# Patient Record
Sex: Female | Born: 1960 | Race: Black or African American | Hispanic: No | Marital: Single | Smoking: Never smoker
Health system: Southern US, Community
[De-identification: ages and names within clinical notes are randomized; demographics above are authoritative.]

## PROBLEM LIST (undated history)

## (undated) DIAGNOSIS — M542 Cervicalgia: Secondary | ICD-10-CM

## (undated) DIAGNOSIS — M545 Low back pain, unspecified: Secondary | ICD-10-CM

## (undated) DIAGNOSIS — I10 Essential (primary) hypertension: Secondary | ICD-10-CM

## (undated) DIAGNOSIS — E785 Hyperlipidemia, unspecified: Secondary | ICD-10-CM

## (undated) HISTORY — DX: Essential (primary) hypertension: I10

## (undated) HISTORY — PX: WISDOM TOOTH EXTRACTION: SHX21

## (undated) HISTORY — DX: Low back pain: M54.5

## (undated) HISTORY — PX: MOUTH SURGERY: SHX715

## (undated) HISTORY — DX: Low back pain, unspecified: M54.50

## (undated) HISTORY — DX: Hyperlipidemia, unspecified: E78.5

## (undated) HISTORY — PX: DILATION AND CURETTAGE OF UTERUS: SHX78

## (undated) HISTORY — PX: COLONOSCOPY: SHX174

## (undated) HISTORY — DX: Cervicalgia: M54.2

---

## 1999-03-17 ENCOUNTER — Other Ambulatory Visit: Admission: RE | Admit: 1999-03-17 | Discharge: 1999-03-17 | Payer: Self-pay | Admitting: Obstetrics and Gynecology

## 2000-08-03 ENCOUNTER — Other Ambulatory Visit: Admission: RE | Admit: 2000-08-03 | Discharge: 2000-08-03 | Payer: Self-pay | Admitting: Obstetrics and Gynecology

## 2000-09-28 ENCOUNTER — Ambulatory Visit (HOSPITAL_COMMUNITY): Admission: RE | Admit: 2000-09-28 | Discharge: 2000-09-28 | Payer: Self-pay | Admitting: Obstetrics and Gynecology

## 2000-09-28 ENCOUNTER — Encounter: Payer: Self-pay | Admitting: Obstetrics and Gynecology

## 2003-05-14 ENCOUNTER — Other Ambulatory Visit: Admission: RE | Admit: 2003-05-14 | Discharge: 2003-05-14 | Payer: Self-pay | Admitting: Obstetrics and Gynecology

## 2003-05-22 ENCOUNTER — Ambulatory Visit (HOSPITAL_COMMUNITY): Admission: RE | Admit: 2003-05-22 | Discharge: 2003-05-22 | Payer: Self-pay | Admitting: Obstetrics and Gynecology

## 2004-06-17 ENCOUNTER — Other Ambulatory Visit: Admission: RE | Admit: 2004-06-17 | Discharge: 2004-06-17 | Payer: Self-pay | Admitting: Obstetrics and Gynecology

## 2004-07-08 ENCOUNTER — Ambulatory Visit (HOSPITAL_COMMUNITY): Admission: RE | Admit: 2004-07-08 | Discharge: 2004-07-08 | Payer: Self-pay | Admitting: Obstetrics and Gynecology

## 2005-08-19 ENCOUNTER — Ambulatory Visit (HOSPITAL_COMMUNITY): Admission: RE | Admit: 2005-08-19 | Discharge: 2005-08-19 | Payer: Self-pay | Admitting: Obstetrics and Gynecology

## 2005-08-24 ENCOUNTER — Other Ambulatory Visit: Admission: RE | Admit: 2005-08-24 | Discharge: 2005-08-24 | Payer: Self-pay | Admitting: Obstetrics and Gynecology

## 2006-08-31 ENCOUNTER — Ambulatory Visit (HOSPITAL_COMMUNITY): Admission: RE | Admit: 2006-08-31 | Discharge: 2006-08-31 | Payer: Self-pay | Admitting: Obstetrics and Gynecology

## 2007-09-06 ENCOUNTER — Ambulatory Visit (HOSPITAL_COMMUNITY): Admission: RE | Admit: 2007-09-06 | Discharge: 2007-09-06 | Payer: Self-pay | Admitting: Obstetrics and Gynecology

## 2008-01-15 ENCOUNTER — Encounter: Admission: RE | Admit: 2008-01-15 | Discharge: 2008-01-15 | Payer: Self-pay | Admitting: Family Medicine

## 2008-10-09 ENCOUNTER — Ambulatory Visit (HOSPITAL_COMMUNITY): Admission: RE | Admit: 2008-10-09 | Discharge: 2008-10-09 | Payer: Self-pay | Admitting: Obstetrics and Gynecology

## 2009-10-20 ENCOUNTER — Ambulatory Visit (HOSPITAL_COMMUNITY): Admission: RE | Admit: 2009-10-20 | Discharge: 2009-10-20 | Payer: Self-pay | Admitting: Obstetrics and Gynecology

## 2011-03-15 ENCOUNTER — Other Ambulatory Visit (HOSPITAL_COMMUNITY): Payer: Self-pay | Admitting: Obstetrics and Gynecology

## 2011-03-15 DIAGNOSIS — Z1231 Encounter for screening mammogram for malignant neoplasm of breast: Secondary | ICD-10-CM

## 2011-03-23 ENCOUNTER — Ambulatory Visit (HOSPITAL_COMMUNITY)
Admission: RE | Admit: 2011-03-23 | Discharge: 2011-03-23 | Disposition: A | Discharge status: Home / Self Care | Payer: BC Managed Care – PPO | Source: Ambulatory Visit | Attending: Obstetrics and Gynecology | Admitting: Obstetrics and Gynecology

## 2011-03-23 DIAGNOSIS — Z1231 Encounter for screening mammogram for malignant neoplasm of breast: Secondary | ICD-10-CM | POA: Insufficient documentation

## 2012-03-28 ENCOUNTER — Encounter: Payer: Self-pay | Admitting: Obstetrics and Gynecology

## 2012-03-28 ENCOUNTER — Other Ambulatory Visit: Payer: Self-pay | Admitting: Obstetrics and Gynecology

## 2012-03-28 ENCOUNTER — Ambulatory Visit (INDEPENDENT_AMBULATORY_CARE_PROVIDER_SITE_OTHER): Payer: BC Managed Care – PPO | Admitting: Obstetrics and Gynecology

## 2012-03-28 VITALS — BP 130/72 | Ht 63.5 in | Wt 160.0 lb

## 2012-03-28 DIAGNOSIS — Z01419 Encounter for gynecological examination (general) (routine) without abnormal findings: Secondary | ICD-10-CM

## 2012-03-28 DIAGNOSIS — Z1231 Encounter for screening mammogram for malignant neoplasm of breast: Secondary | ICD-10-CM

## 2012-03-28 DIAGNOSIS — Z124 Encounter for screening for malignant neoplasm of cervix: Secondary | ICD-10-CM

## 2012-03-28 NOTE — Progress Notes (Signed)
Subjective:    Cheyenne Armstrong is a 51 y.o. female G1P0 who presents for annual exam. The patient complains of trouble sleeping. She is not sexually active.  The following portions of the patient's history were reviewed and updated as appropriate: allergies, current medications, past family history, past medical history, past social history, past surgical history and problem list.  Review of Systems Pertinent items are noted in HPI. Gastrointestinal:No change in bowel habits, no abdominal pain, no rectal bleeding Genitourinary:negative for dysuria, frequency, hematuria, nocturia and urinary incontinence    Objective:     BP 130/72  Ht 5' 3.5" (1.613 m)  Wt 160 lb (72.576 kg)  BMI 27.90 kg/m2  Weight:  Wt Readings from Last 1 Encounters:  03/28/12 160 lb (72.576 kg)     BMI: Body mass index is 27.90 kg/(m^2). General Appearance: Alert, appropriate appearance for age. No acute distress HEENT: Grossly normal Neck / Thyroid: Supple, no masses, nodes or enlargement Lungs: clear to auscultation bilaterally Back: No CVA tenderness Breast Exam: No masses or nodes.No dimpling, nipple retraction or discharge. Cardiovascular: Regular rate and rhythm. S1, S2, no murmur Gastrointestinal: Soft, non-tender, no masses or organomegaly  ++++++++++++++++++++++++++++++++++++++++++++++++++++++++  Pelvic Exam: External genitalia: normal general appearance Vaginal: normal without tenderness, induration or masses and atrophic mucosa Cervix: normal appearance Adnexa: normal bimanual exam Uterus: normal size shape and consistency Rectovaginal: normal rectal, no masses  ++++++++++++++++++++++++++++++++++++++++++++++++++++++++  Lymphatic Exam: Non-palpable nodes in neck, clavicular, axillary, or inguinal regions  Psychiatric: Alert and oriented, appropriate affect.@OBJECTIVEEN @      Assessment:    Normal gyn exam Menopause   Insomnia  Overweight or obese: Yes  Pelvic relaxation:  No  Menopausal symptoms: Yes. Severe: Yes.   Plan:    Mammogram. Pap smear.   Follow-up:  for annual exam.  Strategies for improving sleeve were reviewed.  The updated Pap smear screening guidelines were discussed with the patient. The patient requested that I obtain a Pap smear: Yes.  Kegel exercises discussed: Yes.  Proper diet and regular exercise were reviewed.  Annual mammograms recommended starting at age 75. Proper breast care was discussed.  Screening colonoscopy is recommended beginning at age 43.  Regular health maintenance was reviewed.  Sleep hygiene was discussed.  Adequate calcium and vitamin D intake was emphasized.  Leonard Schwartz M.D.   Regular Periods: no Mammogram: yes  Monthly Breast Ex.: no Exercise: yes  Tetanus < 10 years: yes Seatbelts: yes  NI. Bladder Functn.: yes Abuse at home: no  Daily BM's: yes Stressful Work: no  Healthy Diet: yes Sigmoid-Colonoscopy: n/a  Calcium: no Medical problems this year: pt c/o lack of sleep   LAST PAP:03/26/2009  Contraception: Condoms  Mammogram:  03/2011  PCP: Dr. Sherlyn Lick  PMH: None  FMH: None  Last Bone Scan: N/A

## 2012-03-29 LAB — PAP IG W/ RFLX HPV ASCU

## 2012-04-05 ENCOUNTER — Ambulatory Visit (HOSPITAL_COMMUNITY)
Admission: RE | Admit: 2012-04-05 | Discharge: 2012-04-05 | Disposition: A | Discharge status: Home / Self Care | Payer: BC Managed Care – PPO | Source: Ambulatory Visit | Attending: Obstetrics and Gynecology | Admitting: Obstetrics and Gynecology

## 2012-04-05 DIAGNOSIS — Z1231 Encounter for screening mammogram for malignant neoplasm of breast: Secondary | ICD-10-CM | POA: Insufficient documentation

## 2012-04-07 ENCOUNTER — Encounter: Payer: Self-pay | Admitting: Internal Medicine

## 2012-04-27 ENCOUNTER — Ambulatory Visit (AMBULATORY_SURGERY_CENTER): Payer: BC Managed Care – PPO

## 2012-04-27 ENCOUNTER — Encounter: Payer: Self-pay | Admitting: Internal Medicine

## 2012-04-27 VITALS — Ht 64.0 in | Wt 159.9 lb

## 2012-04-27 DIAGNOSIS — Z1211 Encounter for screening for malignant neoplasm of colon: Secondary | ICD-10-CM

## 2012-04-27 MED ORDER — NA SULFATE-K SULFATE-MG SULF 17.5-3.13-1.6 GM/177ML PO SOLN: 1.0000 | Freq: Once | ORAL | Status: DC

## 2012-05-26 ENCOUNTER — Encounter: Payer: Self-pay | Admitting: Internal Medicine

## 2012-05-26 ENCOUNTER — Ambulatory Visit (AMBULATORY_SURGERY_CENTER): Payer: BC Managed Care – PPO | Admitting: Internal Medicine

## 2012-05-26 VITALS — BP 134/84 | HR 65 | Temp 96.4°F | Resp 18 | Ht 64.0 in | Wt 159.0 lb

## 2012-05-26 DIAGNOSIS — K5730 Diverticulosis of large intestine without perforation or abscess without bleeding: Secondary | ICD-10-CM

## 2012-05-26 DIAGNOSIS — Z1211 Encounter for screening for malignant neoplasm of colon: Secondary | ICD-10-CM

## 2012-05-26 MED ORDER — SODIUM CHLORIDE 0.9 % IV SOLN: 500.0000 mL | INTRAVENOUS | Status: DC

## 2012-05-26 NOTE — Progress Notes (Signed)
Patient did not experience any of the following events: a burn prior to discharge; a fall within the facility; wrong site/side/patient/procedure/implant event; or a hospital transfer or hospital admission upon discharge from the facility. (G8907) Patient did not have preoperative order for IV antibiotic SSI prophylaxis. (G8918)  

## 2012-05-26 NOTE — Op Note (Signed)
[facility] [address] [place], [address]   COLONOSCOPY PROCEDURE REPORT  PATIENT: Cheyenne Armstrong, Cheyenne Armstrong  MR#: [id number] BIRTHDATE: June 03, 1961 , 51  yrs. old GENDER: Female ENDOSCOPIST: Iva Boop, MD, Clementeen Graham REFERRED BY:   Janine Limbo, M.D. PROCEDURE DATE:  05/26/2012 PROCEDURE:   Colonoscopy, diagnostic ASA CLASS:   Class II INDICATIONS:average risk screening. MEDICATIONS: propofol (Diprivan) 200mg  IV, MAC sedation, administered by CRNA, and These medications were titrated to patient response per physician's verbal order  DESCRIPTION OF PROCEDURE:   After the risks benefits and alternatives of the procedure were thoroughly explained, informed consent was obtained.  A digital rectal exam revealed no abnormalities of the rectum.   The LB [id number]  endoscope was introduced through the anus and advanced to the cecum, which was identified by both the appendix and ileocecal valve. No adverse events experienced.   The quality of the prep was Suprep excellent The instrument was then slowly withdrawn as the colon was fully examined.      COLON FINDINGS: Moderate diverticulosis was noted The finding was in the left colon.   The colon mucosa was otherwise normal. Retroflexed views revealed no abnormalities. The time to cecum=1 minutes 53 seconds.  Withdrawal time=7 minutes 05 seconds.  The scope was withdrawn and the procedure completed. COMPLICATIONS: There were no complications.  ENDOSCOPIC IMPRESSION: 1.   Moderate diverticulosis was noted in the left colon 2.   The colon mucosa was otherwise normal - excellent prep  RECOMMENDATIONS: Repeat Colonscopy in 10 years.   eSigned:  Iva Boop, MD, [facility] 05/26/2012 11:11 AM   cc: The Patient, Kirkland Hun, MD and Milus Height, [place]

## 2012-05-26 NOTE — Patient Instructions (Addendum)
There was diverticulosis in your colon (pouches and thickened muscles) but no polyps or cancer. This is good news!  Next routine colonoscopy in 10 years.   Thank you for choosing me and [facility].  Iva Boop, MD, FACG   YOU HAD AN ENDOSCOPIC PROCEDURE TODAY AT THE  ENDOSCOPY CENTER: Refer to the procedure report that was given to you for any specific questions about what was found during the examination.  If the procedure report does not answer your questions, please call your gastroenterologist to clarify.  If you requested that your care partner not be given the details of your procedure findings, then the procedure report has been included in a sealed envelope for you to review at your convenience later.  YOU SHOULD EXPECT: Some feelings of bloating in the abdomen. Passage of more gas than usual.  Walking can help get rid of the air that was put into your GI tract during the procedure and reduce the bloating. If you had a lower endoscopy (such as a colonoscopy or flexible sigmoidoscopy) you may notice spotting of blood in your stool or on the toilet paper. If you underwent a bowel prep for your procedure, then you may not have a normal bowel movement for a few days.  DIET: Your first meal following the procedure should be a light meal and then it is ok to progress to your normal diet.  A half-sandwich or bowl of soup is an example of a good first meal.  Heavy or fried foods are harder to digest and may make you feel nauseous or bloated.  Likewise meals heavy in dairy and vegetables can cause extra gas to form and this can also increase the bloating.  Drink plenty of fluids but you should avoid alcoholic beverages for 24 hours.  ACTIVITY: Your care partner should take you home directly after the procedure.  You should plan to take it easy, moving slowly for the rest of the day.  You can resume normal activity the day after the procedure however you should NOT DRIVE or  use heavy machinery for 24 hours (because of the sedation medicines used during the test).    SYMPTOMS TO REPORT IMMEDIATELY: A gastroenterologist can be reached at any hour.  During normal business hours, 8:30 AM to 5:00 PM Monday through Friday, call [phone].  After hours and on weekends, please call the GI answering service at [phone] who will take a message and have the physician on call contact you.   Following lower endoscopy (colonoscopy or flexible sigmoidoscopy):  Excessive amounts of blood in the stool  Significant tenderness or worsening of abdominal pains  Swelling of the abdomen that is new, acute  Fever of 100F or higher  Following upper endoscopy (EGD)  Vomiting of blood or coffee ground material  New chest pain or pain under the shoulder blades  Painful or persistently difficult swallowing  New shortness of breath  Fever of 100F or higher  Black, tarry-looking stools  FOLLOW UP: If any biopsies were taken you will be contacted by phone or by letter within the next 1-3 weeks.  Call your gastroenterologist if you have not heard about the biopsies in 3 weeks.  Our staff will call the home number listed on your records the next business day following your procedure to check on you and address any questions or concerns that you may have at that time regarding the information given to you following your procedure. This is a courtesy call  and so if there is no answer at the home number and we have not heard from you through the emergency physician on call, we will assume that you have returned to your regular daily activities without incident.  SIGNATURES/CONFIDENTIALITY: You and/or your care partner have signed paperwork which will be entered into your electronic medical record.  These signatures attest to the fact that that the information above on your After Visit Summary has been reviewed and is understood.  Full responsibility of the confidentiality of this  discharge information lies with you and/or your care-partner. YOU HAD AN ENDOSCOPIC PROCEDURE TODAY AT THE [facility]: Refer to the procedure report that was given to you for any specific questions about what was found during the examination.  If the procedure report does not answer your questions, please call your gastroenterologist to clarify.  If you requested that your care partner not be given the details of your procedure findings, then the procedure report has been included in a sealed envelope for you to review at your convenience later.  YOU SHOULD EXPECT: Some feelings of bloating in the abdomen. Passage of more gas than usual.  Walking can help get rid of the air that was put into your GI tract during the procedure and reduce the bloating. If you had a lower endoscopy (such as a colonoscopy or flexible sigmoidoscopy) you may notice spotting of blood in your stool or on the toilet paper. If you underwent a bowel prep for your procedure, then you may not have a normal bowel movement for a few days.  DIET: Your first meal following the procedure should be a light meal and then it is ok to progress to your normal diet.  A half-sandwich or bowl of soup is an example of a good first meal.  Heavy or fried foods are harder to digest and may make you feel nauseous or bloated.  Likewise meals heavy in dairy and vegetables can cause extra gas to form and this can also increase the bloating.  Drink plenty of fluids but you should avoid alcoholic beverages for 24 hours.  ACTIVITY: Your care partner should take you home directly after the procedure.  You should plan to take it easy, moving slowly for the rest of the day.  You can resume normal activity the day after the procedure however you should NOT DRIVE or use heavy machinery for 24 hours (because of the sedation medicines used during the test).    SYMPTOMS TO REPORT IMMEDIATELY: A gastroenterologist can be reached at any hour.  During  normal business hours, 8:30 AM to 5:00 PM Monday through Friday, call [phone].  After hours and on weekends, please call the GI answering service at [phone] who will take a message and have the physician on call contact you.   Following lower endoscopy (colonoscopy or flexible sigmoidoscopy):  Excessive amounts of blood in the stool  Significant tenderness or worsening of abdominal pains  Swelling of the abdomen that is new, acute  Fever of 100F or higher  Following upper endoscopy (EGD)  Vomiting of blood or coffee ground material  New chest pain or pain under the shoulder blades  Painful or persistently difficult swallowing  New shortness of breath  Fever of 100F or higher  Black, tarry-looking stools  FOLLOW UP: If any biopsies were taken you will be contacted by phone or by letter within the next 1-3 weeks.  Call your gastroenterologist if you have not heard about the biopsies  in 3 weeks.  Our staff will call the home number listed on your records the next business day following your procedure to check on you and address any questions or concerns that you may have at that time regarding the information given to you following your procedure. This is a courtesy call and so if there is no answer at the home number and we have not heard from you through the emergency physician on call, we will assume that you have returned to your regular daily activities without incident.  SIGNATURES/CONFIDENTIALITY: You and/or your care partner have signed paperwork which will be entered into your electronic medical record.  These signatures attest to the fact that that the information above on your After Visit Summary has been reviewed and is understood.  Full responsibility of the confidentiality of this discharge information lies with you and/or your care-partner.    Diverticulosis and high fiber information given.  Repeat colonoscopy in 10 years 2023

## 2012-05-26 NOTE — Progress Notes (Signed)
No egg or soy allergy per pt. ewm 

## 2012-05-29 ENCOUNTER — Telehealth: Payer: Self-pay | Admitting: *Deleted

## 2012-05-29 NOTE — Telephone Encounter (Signed)
  Follow up Call-  Call back number 05/26/2012  Post procedure Call Back phone  # [phone]  Permission to leave phone message Yes     No answer,left message!

## 2013-06-11 ENCOUNTER — Other Ambulatory Visit: Payer: Self-pay | Admitting: Obstetrics and Gynecology

## 2013-06-11 DIAGNOSIS — Z1231 Encounter for screening mammogram for malignant neoplasm of breast: Secondary | ICD-10-CM

## 2013-06-15 ENCOUNTER — Ambulatory Visit (HOSPITAL_COMMUNITY)
Admission: RE | Admit: 2013-06-15 | Discharge: 2013-06-15 | Disposition: A | Discharge status: Home / Self Care | Payer: BC Managed Care – PPO | Source: Ambulatory Visit | Attending: Obstetrics and Gynecology | Admitting: Obstetrics and Gynecology

## 2013-06-15 DIAGNOSIS — Z1231 Encounter for screening mammogram for malignant neoplasm of breast: Secondary | ICD-10-CM | POA: Insufficient documentation

## 2014-04-15 ENCOUNTER — Encounter: Payer: Self-pay | Admitting: Internal Medicine

## 2014-07-04 ENCOUNTER — Other Ambulatory Visit (HOSPITAL_COMMUNITY): Payer: Self-pay | Admitting: Obstetrics and Gynecology

## 2014-07-04 DIAGNOSIS — Z1231 Encounter for screening mammogram for malignant neoplasm of breast: Secondary | ICD-10-CM

## 2014-07-10 ENCOUNTER — Ambulatory Visit (HOSPITAL_COMMUNITY)
Admission: RE | Admit: 2014-07-10 | Discharge: 2014-07-10 | Disposition: A | Discharge status: Home / Self Care | Payer: BC Managed Care – PPO | Source: Ambulatory Visit | Attending: Obstetrics and Gynecology | Admitting: Obstetrics and Gynecology

## 2014-07-10 DIAGNOSIS — Z1231 Encounter for screening mammogram for malignant neoplasm of breast: Secondary | ICD-10-CM | POA: Diagnosis not present

## 2015-09-10 ENCOUNTER — Other Ambulatory Visit: Payer: Self-pay

## 2015-09-10 DIAGNOSIS — Z1231 Encounter for screening mammogram for malignant neoplasm of breast: Secondary | ICD-10-CM

## 2015-09-29 ENCOUNTER — Ambulatory Visit: Payer: BC Managed Care – PPO

## 2015-10-23 ENCOUNTER — Ambulatory Visit: Payer: BC Managed Care – PPO

## 2015-11-21 ENCOUNTER — Ambulatory Visit
Admission: RE | Admit: 2015-11-21 | Discharge: 2015-11-21 | Disposition: A | Discharge status: Home / Self Care | Payer: BC Managed Care – PPO | Source: Ambulatory Visit

## 2015-11-21 DIAGNOSIS — Z1231 Encounter for screening mammogram for malignant neoplasm of breast: Secondary | ICD-10-CM

## 2016-11-24 ENCOUNTER — Other Ambulatory Visit: Payer: Self-pay | Admitting: Obstetrics and Gynecology

## 2016-11-24 DIAGNOSIS — Z1231 Encounter for screening mammogram for malignant neoplasm of breast: Secondary | ICD-10-CM

## 2016-12-27 ENCOUNTER — Ambulatory Visit: Payer: BC Managed Care – PPO

## 2016-12-27 ENCOUNTER — Ambulatory Visit
Admission: RE | Admit: 2016-12-27 | Discharge: 2016-12-27 | Disposition: A | Discharge status: Home / Self Care | Payer: BC Managed Care – PPO | Source: Ambulatory Visit | Attending: Obstetrics and Gynecology | Admitting: Obstetrics and Gynecology

## 2016-12-27 DIAGNOSIS — Z1231 Encounter for screening mammogram for malignant neoplasm of breast: Secondary | ICD-10-CM

## 2018-01-25 ENCOUNTER — Other Ambulatory Visit: Payer: Self-pay | Admitting: Obstetrics and Gynecology

## 2018-01-25 DIAGNOSIS — Z1231 Encounter for screening mammogram for malignant neoplasm of breast: Secondary | ICD-10-CM

## 2018-02-20 ENCOUNTER — Ambulatory Visit
Admission: RE | Admit: 2018-02-20 | Discharge: 2018-02-20 | Disposition: A | Discharge status: Home / Self Care | Payer: BC Managed Care – PPO | Source: Ambulatory Visit | Attending: Obstetrics and Gynecology | Admitting: Obstetrics and Gynecology

## 2018-02-20 DIAGNOSIS — Z1231 Encounter for screening mammogram for malignant neoplasm of breast: Secondary | ICD-10-CM

## 2018-11-24 ENCOUNTER — Other Ambulatory Visit: Payer: Self-pay | Admitting: Physician Assistant

## 2018-11-24 DIAGNOSIS — Z13820 Encounter for screening for osteoporosis: Secondary | ICD-10-CM

## 2018-11-24 DIAGNOSIS — Z1231 Encounter for screening mammogram for malignant neoplasm of breast: Secondary | ICD-10-CM

## 2018-12-04 ENCOUNTER — Other Ambulatory Visit: Payer: Self-pay | Admitting: *Deleted

## 2018-12-04 DIAGNOSIS — Z20822 Contact with and (suspected) exposure to covid-19: Secondary | ICD-10-CM

## 2018-12-10 LAB — NOVEL CORONAVIRUS, NAA: SARS-CoV-2, NAA: NOT DETECTED

## 2019-02-23 ENCOUNTER — Other Ambulatory Visit: Payer: Self-pay

## 2019-02-23 ENCOUNTER — Ambulatory Visit
Admission: RE | Admit: 2019-02-23 | Discharge: 2019-02-23 | Disposition: A | Discharge status: Home / Self Care | Payer: BC Managed Care – PPO | Source: Ambulatory Visit | Attending: Physician Assistant | Admitting: Physician Assistant

## 2019-02-23 DIAGNOSIS — Z13820 Encounter for screening for osteoporosis: Secondary | ICD-10-CM

## 2019-02-23 DIAGNOSIS — Z1231 Encounter for screening mammogram for malignant neoplasm of breast: Secondary | ICD-10-CM

## 2019-02-26 ENCOUNTER — Other Ambulatory Visit: Payer: Self-pay | Admitting: Physician Assistant

## 2019-02-26 DIAGNOSIS — R928 Other abnormal and inconclusive findings on diagnostic imaging of breast: Secondary | ICD-10-CM

## 2019-02-27 ENCOUNTER — Other Ambulatory Visit: Payer: Self-pay

## 2019-02-27 ENCOUNTER — Ambulatory Visit
Admission: RE | Admit: 2019-02-27 | Discharge: 2019-02-27 | Disposition: A | Discharge status: Home / Self Care | Payer: BC Managed Care – PPO | Source: Ambulatory Visit | Attending: Physician Assistant | Admitting: Physician Assistant

## 2019-02-27 ENCOUNTER — Other Ambulatory Visit: Payer: BC Managed Care – PPO

## 2019-02-27 ENCOUNTER — Ambulatory Visit: Admission: RE | Admit: 2019-02-27 | Payer: BC Managed Care – PPO | Source: Ambulatory Visit

## 2019-02-27 DIAGNOSIS — R928 Other abnormal and inconclusive findings on diagnostic imaging of breast: Secondary | ICD-10-CM

## 2019-03-02 ENCOUNTER — Other Ambulatory Visit: Payer: BC Managed Care – PPO

## 2020-04-04 ENCOUNTER — Other Ambulatory Visit: Payer: Self-pay | Admitting: Physician Assistant

## 2020-04-04 DIAGNOSIS — Z1231 Encounter for screening mammogram for malignant neoplasm of breast: Secondary | ICD-10-CM

## 2020-06-23 DIAGNOSIS — Z20822 Contact with and (suspected) exposure to covid-19: Secondary | ICD-10-CM | POA: Diagnosis not present

## 2020-07-21 DIAGNOSIS — E7800 Pure hypercholesterolemia, unspecified: Secondary | ICD-10-CM | POA: Diagnosis not present

## 2020-07-21 DIAGNOSIS — Z0000 Encounter for general adult medical examination without abnormal findings: Secondary | ICD-10-CM | POA: Diagnosis not present

## 2020-07-21 DIAGNOSIS — I10 Essential (primary) hypertension: Secondary | ICD-10-CM | POA: Diagnosis not present

## 2020-08-19 DIAGNOSIS — M9901 Segmental and somatic dysfunction of cervical region: Secondary | ICD-10-CM | POA: Diagnosis not present

## 2020-08-19 DIAGNOSIS — M9902 Segmental and somatic dysfunction of thoracic region: Secondary | ICD-10-CM | POA: Diagnosis not present

## 2020-08-19 DIAGNOSIS — M531 Cervicobrachial syndrome: Secondary | ICD-10-CM | POA: Diagnosis not present

## 2020-08-19 DIAGNOSIS — M5032 Other cervical disc degeneration, mid-cervical region, unspecified level: Secondary | ICD-10-CM | POA: Diagnosis not present

## 2020-08-20 DIAGNOSIS — Z124 Encounter for screening for malignant neoplasm of cervix: Secondary | ICD-10-CM | POA: Diagnosis not present

## 2020-08-20 DIAGNOSIS — Z01419 Encounter for gynecological examination (general) (routine) without abnormal findings: Secondary | ICD-10-CM | POA: Diagnosis not present

## 2020-08-25 DIAGNOSIS — H40013 Open angle with borderline findings, low risk, bilateral: Secondary | ICD-10-CM | POA: Diagnosis not present

## 2020-08-27 DIAGNOSIS — M9902 Segmental and somatic dysfunction of thoracic region: Secondary | ICD-10-CM | POA: Diagnosis not present

## 2020-08-27 DIAGNOSIS — M9901 Segmental and somatic dysfunction of cervical region: Secondary | ICD-10-CM | POA: Diagnosis not present

## 2020-08-27 DIAGNOSIS — M531 Cervicobrachial syndrome: Secondary | ICD-10-CM | POA: Diagnosis not present

## 2020-08-27 DIAGNOSIS — M5032 Other cervical disc degeneration, mid-cervical region, unspecified level: Secondary | ICD-10-CM | POA: Diagnosis not present

## 2020-09-08 DIAGNOSIS — M9901 Segmental and somatic dysfunction of cervical region: Secondary | ICD-10-CM | POA: Diagnosis not present

## 2020-09-08 DIAGNOSIS — M5032 Other cervical disc degeneration, mid-cervical region, unspecified level: Secondary | ICD-10-CM | POA: Diagnosis not present

## 2020-09-08 DIAGNOSIS — M531 Cervicobrachial syndrome: Secondary | ICD-10-CM | POA: Diagnosis not present

## 2020-09-08 DIAGNOSIS — M9902 Segmental and somatic dysfunction of thoracic region: Secondary | ICD-10-CM | POA: Diagnosis not present

## 2020-09-22 DIAGNOSIS — M5032 Other cervical disc degeneration, mid-cervical region, unspecified level: Secondary | ICD-10-CM | POA: Diagnosis not present

## 2020-09-22 DIAGNOSIS — M531 Cervicobrachial syndrome: Secondary | ICD-10-CM | POA: Diagnosis not present

## 2020-09-22 DIAGNOSIS — M9901 Segmental and somatic dysfunction of cervical region: Secondary | ICD-10-CM | POA: Diagnosis not present

## 2020-09-22 DIAGNOSIS — M9902 Segmental and somatic dysfunction of thoracic region: Secondary | ICD-10-CM | POA: Diagnosis not present

## 2020-09-25 ENCOUNTER — Ambulatory Visit: Payer: BC Managed Care – PPO

## 2020-09-25 ENCOUNTER — Ambulatory Visit: Payer: Self-pay | Attending: Internal Medicine

## 2020-09-25 DIAGNOSIS — Z23 Encounter for immunization: Secondary | ICD-10-CM

## 2020-09-25 NOTE — Progress Notes (Signed)
   Covid-19 Vaccination Clinic  Name:  Cheyenne Armstrong    MRN: [id number] DOB: 12-Dec-1960  09/25/2020  [name] was observed post Covid-19 immunization for 15 minutes without incident. She was provided with Vaccine Information Sheet and instruction to access the V-Safe system.   [name] was instructed to call 911 with any severe reactions post vaccine: Marland Kitchen Difficulty breathing  . Swelling of face and throat  . A fast heartbeat  . A bad rash all over body  . Dizziness and weakness   Immunizations Administered    Name Date Dose VIS Date Route   PFIZER Comrnaty(Gray TOP) Covid-19 Vaccine 09/25/2020 12:33 PM 0.3 mL 05/22/2020 Intramuscular   Manufacturer: [employer], [employer]   Lot: [id number]   NDC: [phone]

## 2020-09-29 ENCOUNTER — Other Ambulatory Visit (HOSPITAL_BASED_OUTPATIENT_CLINIC_OR_DEPARTMENT_OTHER): Payer: Self-pay

## 2020-09-29 MED ORDER — PFIZER-BIONT COVID-19 VAC-TRIS 30 MCG/0.3ML IM SUSP
INTRAMUSCULAR | 0 refills | Status: DC
  Filled 2020-09-29: qty 0.3, 1d supply, fill #0

## 2020-10-10 DIAGNOSIS — H40013 Open angle with borderline findings, low risk, bilateral: Secondary | ICD-10-CM | POA: Diagnosis not present

## 2020-10-15 DIAGNOSIS — M9902 Segmental and somatic dysfunction of thoracic region: Secondary | ICD-10-CM | POA: Diagnosis not present

## 2020-10-15 DIAGNOSIS — M531 Cervicobrachial syndrome: Secondary | ICD-10-CM | POA: Diagnosis not present

## 2020-10-15 DIAGNOSIS — M5032 Other cervical disc degeneration, mid-cervical region, unspecified level: Secondary | ICD-10-CM | POA: Diagnosis not present

## 2020-10-15 DIAGNOSIS — M9901 Segmental and somatic dysfunction of cervical region: Secondary | ICD-10-CM | POA: Diagnosis not present

## 2020-10-27 ENCOUNTER — Ambulatory Visit: Payer: Self-pay | Attending: Critical Care Medicine

## 2020-10-27 DIAGNOSIS — Z20822 Contact with and (suspected) exposure to covid-19: Secondary | ICD-10-CM | POA: Insufficient documentation

## 2020-10-28 LAB — SARS-COV-2, NAA 2 DAY TAT

## 2020-10-28 LAB — NOVEL CORONAVIRUS, NAA: SARS-CoV-2, NAA: NOT DETECTED

## 2020-12-11 DIAGNOSIS — J309 Allergic rhinitis, unspecified: Secondary | ICD-10-CM | POA: Diagnosis not present

## 2020-12-18 DIAGNOSIS — R0981 Nasal congestion: Secondary | ICD-10-CM | POA: Diagnosis not present

## 2020-12-18 DIAGNOSIS — J302 Other seasonal allergic rhinitis: Secondary | ICD-10-CM | POA: Diagnosis not present

## 2021-01-29 DIAGNOSIS — H401231 Low-tension glaucoma, bilateral, mild stage: Secondary | ICD-10-CM | POA: Diagnosis not present

## 2021-03-13 ENCOUNTER — Other Ambulatory Visit (HOSPITAL_BASED_OUTPATIENT_CLINIC_OR_DEPARTMENT_OTHER): Payer: Self-pay

## 2021-03-13 ENCOUNTER — Ambulatory Visit: Payer: Self-pay | Attending: Internal Medicine

## 2021-03-13 DIAGNOSIS — Z23 Encounter for immunization: Secondary | ICD-10-CM

## 2021-03-13 MED ORDER — INFLUENZA VAC SPLIT QUAD 0.5 ML IM SUSY
PREFILLED_SYRINGE | INTRAMUSCULAR | 0 refills | Status: DC
  Filled 2021-03-13: qty 0.5, 1d supply, fill #0

## 2021-03-13 NOTE — Progress Notes (Signed)
   Covid-19 Vaccination Clinic  Name:  Jennfier Abdulla    MRN: [id number] DOB: 08-10-1960  03/13/2021  Ms. Broski was observed post Covid-19 immunization for 15 minutes without incident. She was provided with Vaccine Information Sheet and instruction to access the V-Safe system.   Ms. Drumwright was instructed to call 911 with any severe reactions post vaccine: Difficulty breathing  Swelling of face and throat  A fast heartbeat  A bad rash all over body  Dizziness and weakness

## 2021-03-24 ENCOUNTER — Other Ambulatory Visit (HOSPITAL_BASED_OUTPATIENT_CLINIC_OR_DEPARTMENT_OTHER): Payer: Self-pay

## 2021-03-24 MED ORDER — COVID-19MRNA BIVAL VACC PFIZER 30 MCG/0.3ML IM SUSP
INTRAMUSCULAR | 0 refills | Status: DC
  Filled 2021-03-24: qty 0.3, 1d supply, fill #0

## 2021-03-30 ENCOUNTER — Ambulatory Visit: Payer: Self-pay

## 2021-04-29 DIAGNOSIS — M5032 Other cervical disc degeneration, mid-cervical region, unspecified level: Secondary | ICD-10-CM | POA: Diagnosis not present

## 2021-04-29 DIAGNOSIS — M9901 Segmental and somatic dysfunction of cervical region: Secondary | ICD-10-CM | POA: Diagnosis not present

## 2021-04-29 DIAGNOSIS — M531 Cervicobrachial syndrome: Secondary | ICD-10-CM | POA: Diagnosis not present

## 2021-04-29 DIAGNOSIS — M9902 Segmental and somatic dysfunction of thoracic region: Secondary | ICD-10-CM | POA: Diagnosis not present

## 2021-05-12 DIAGNOSIS — M5032 Other cervical disc degeneration, mid-cervical region, unspecified level: Secondary | ICD-10-CM | POA: Diagnosis not present

## 2021-05-12 DIAGNOSIS — M9901 Segmental and somatic dysfunction of cervical region: Secondary | ICD-10-CM | POA: Diagnosis not present

## 2021-05-12 DIAGNOSIS — M531 Cervicobrachial syndrome: Secondary | ICD-10-CM | POA: Diagnosis not present

## 2021-05-12 DIAGNOSIS — M9902 Segmental and somatic dysfunction of thoracic region: Secondary | ICD-10-CM | POA: Diagnosis not present

## 2021-07-16 DIAGNOSIS — R52 Pain, unspecified: Secondary | ICD-10-CM | POA: Diagnosis not present

## 2021-07-16 DIAGNOSIS — J029 Acute pharyngitis, unspecified: Secondary | ICD-10-CM | POA: Diagnosis not present

## 2021-07-16 DIAGNOSIS — I10 Essential (primary) hypertension: Secondary | ICD-10-CM | POA: Diagnosis not present

## 2021-07-16 DIAGNOSIS — Z03818 Encounter for observation for suspected exposure to other biological agents ruled out: Secondary | ICD-10-CM | POA: Diagnosis not present

## 2021-07-16 DIAGNOSIS — R059 Cough, unspecified: Secondary | ICD-10-CM | POA: Diagnosis not present

## 2021-08-20 DIAGNOSIS — E7800 Pure hypercholesterolemia, unspecified: Secondary | ICD-10-CM | POA: Diagnosis not present

## 2021-08-20 DIAGNOSIS — J302 Other seasonal allergic rhinitis: Secondary | ICD-10-CM | POA: Diagnosis not present

## 2021-08-20 DIAGNOSIS — Z0000 Encounter for general adult medical examination without abnormal findings: Secondary | ICD-10-CM | POA: Diagnosis not present

## 2021-08-20 DIAGNOSIS — I10 Essential (primary) hypertension: Secondary | ICD-10-CM | POA: Diagnosis not present

## 2021-09-21 ENCOUNTER — Other Ambulatory Visit: Payer: Self-pay | Admitting: Physician Assistant

## 2021-09-21 DIAGNOSIS — Z1231 Encounter for screening mammogram for malignant neoplasm of breast: Secondary | ICD-10-CM

## 2021-09-22 ENCOUNTER — Ambulatory Visit
Admission: RE | Admit: 2021-09-22 | Discharge: 2021-09-22 | Disposition: A | Discharge status: Home / Self Care | Payer: BC Managed Care – PPO | Source: Ambulatory Visit | Attending: Physician Assistant | Admitting: Physician Assistant

## 2021-09-22 DIAGNOSIS — Z1231 Encounter for screening mammogram for malignant neoplasm of breast: Secondary | ICD-10-CM | POA: Diagnosis not present

## 2021-11-26 DIAGNOSIS — H401231 Low-tension glaucoma, bilateral, mild stage: Secondary | ICD-10-CM | POA: Diagnosis not present

## 2022-05-25 IMAGING — MG MM DIGITAL SCREENING BILAT W/ TOMO AND CAD
8 series · 8 of 24 positions shown · non-contrast
Comparison: Previous exam(s).

CLINICAL DATA: Screening.

EXAM:
DIGITAL SCREENING BILATERAL MAMMOGRAM WITH TOMOSYNTHESIS AND CAD
TECHNIQUE: Bilateral screening digital craniocaudal and mediolateral oblique
mammograms were obtained. Bilateral screening digital breast
tomosynthesis was performed. The images were evaluated with
computer-aided detection.

[L CC synth-2D]
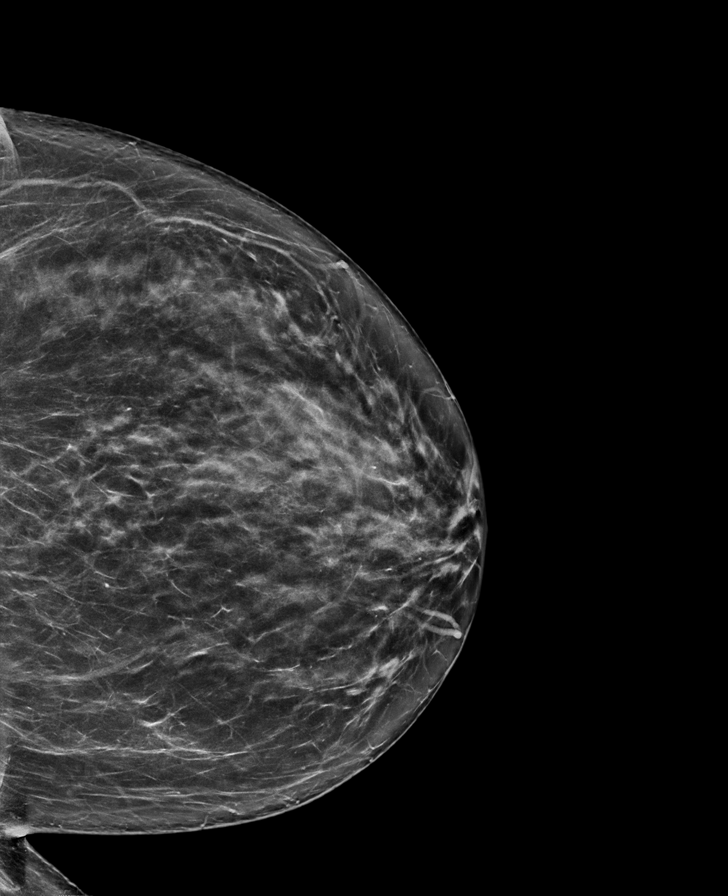

[R CC synth-2D]
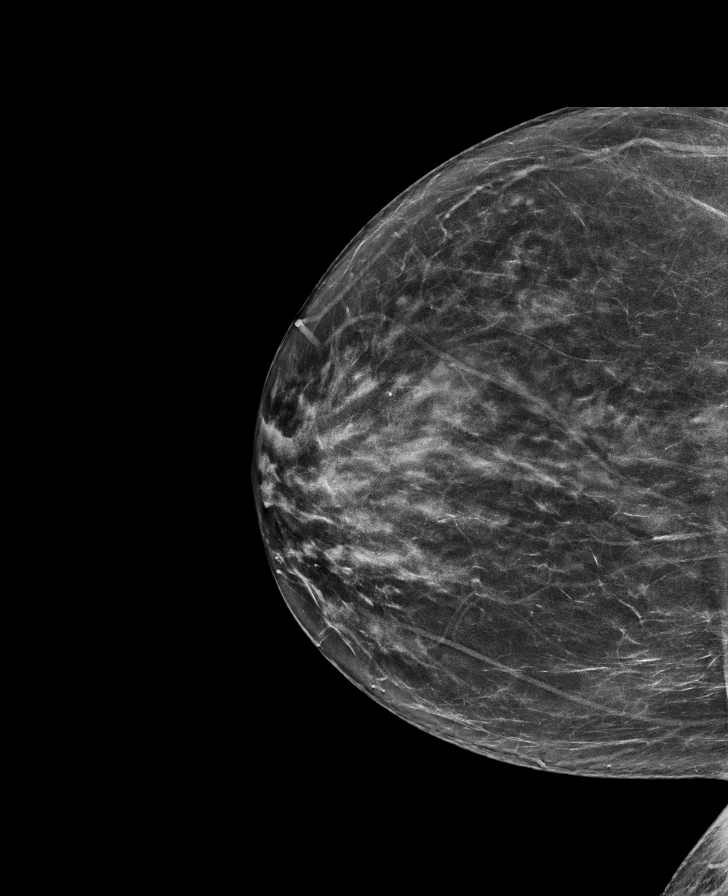

[R MLO synth-2D]
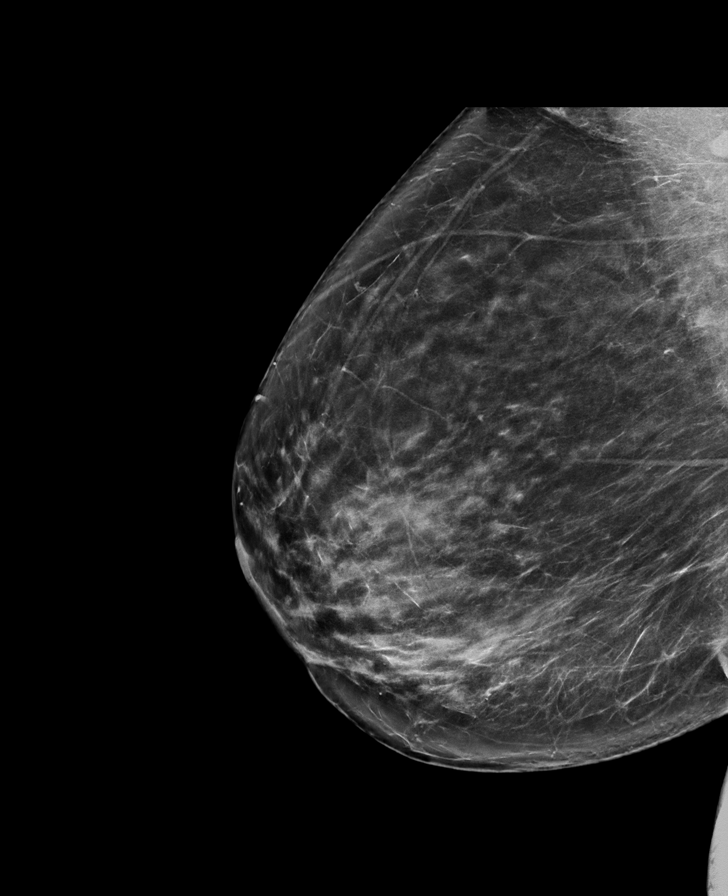

[L MLO synth-2D]
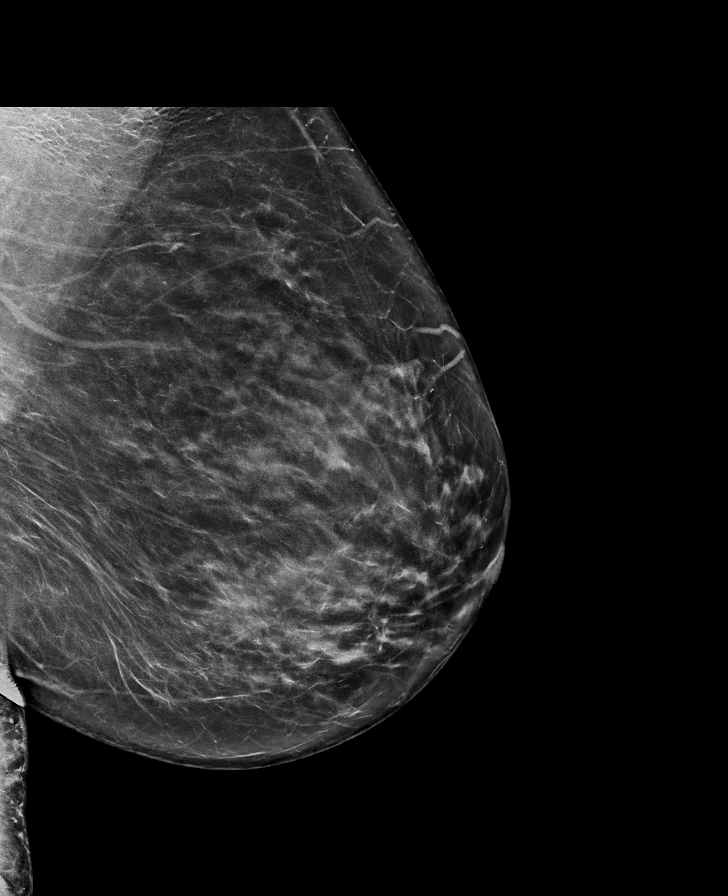

[R MLO tomo · tomo slice 42/83.0]
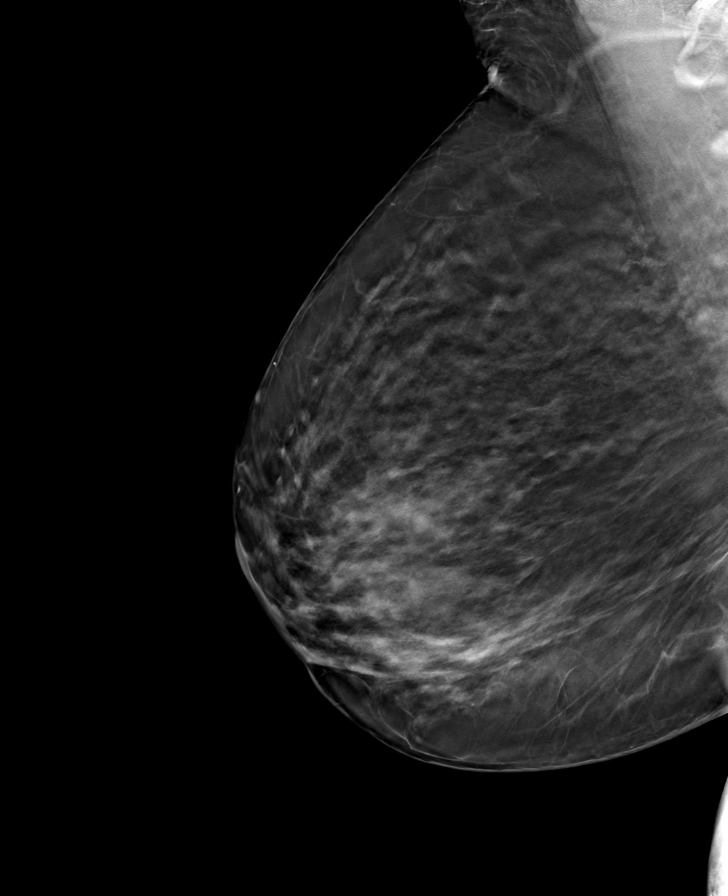

[L MLO tomo · tomo slice 44/87.0]
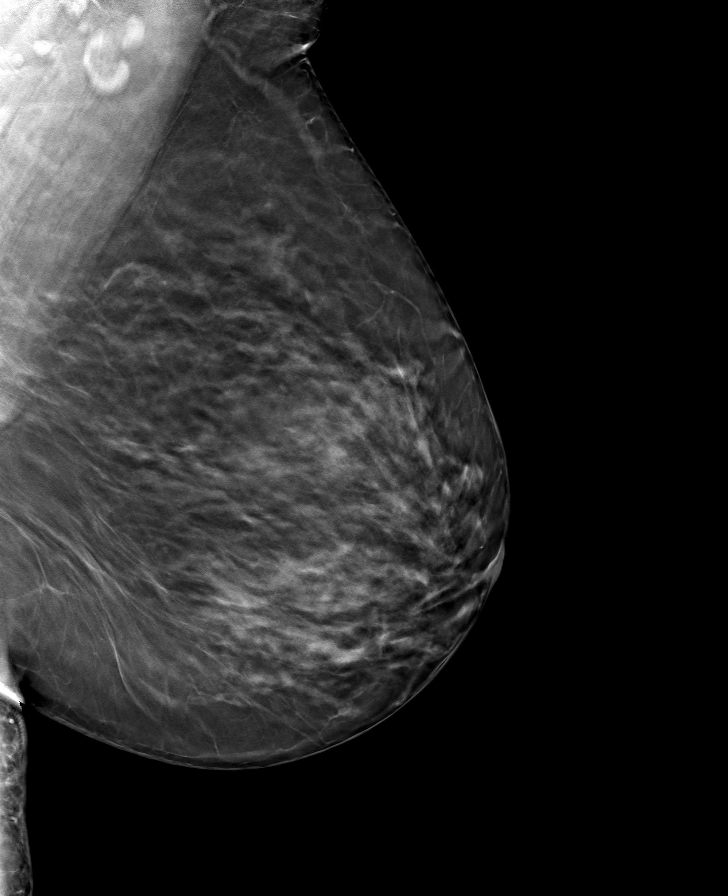

[L CC tomo · tomo slice 39/77.0]
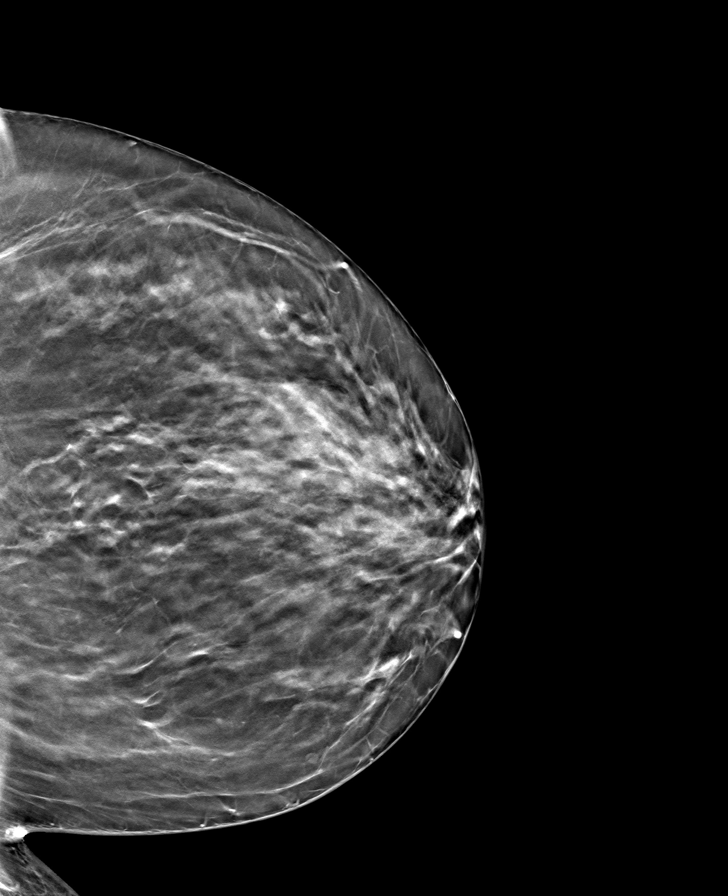

[R CC tomo · tomo slice 39/76.0]
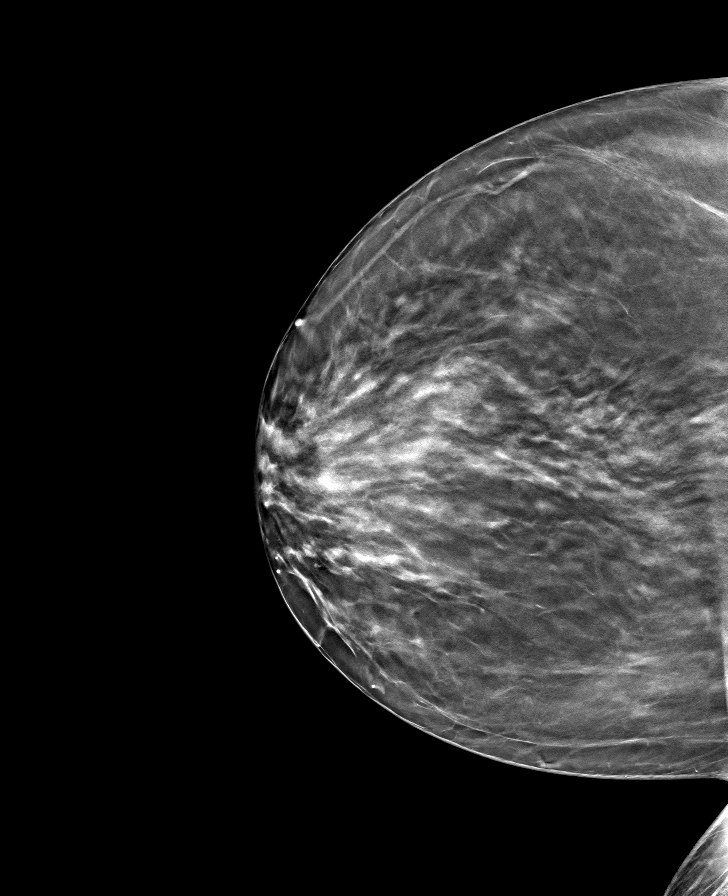

[8 of 24 positions shown; findings below may reference images not displayed]

ACR Breast Density Category c: The breast tissue is heterogeneously
dense, which may obscure small masses.
FINDINGS: There are no findings suspicious for malignancy.
IMPRESSION: No mammographic evidence of malignancy. A result letter of this
screening mammogram will be mailed directly to the patient.

RECOMMENDATION:
Screening mammogram in one year. (Code:Q3-W-BC3)

BI-RADS CATEGORY  1: Negative.

## 2022-06-09 ENCOUNTER — Encounter: Payer: Self-pay | Admitting: Internal Medicine

## 2022-08-31 ENCOUNTER — Other Ambulatory Visit: Payer: Self-pay | Admitting: Physician Assistant

## 2022-08-31 DIAGNOSIS — Z1231 Encounter for screening mammogram for malignant neoplasm of breast: Secondary | ICD-10-CM

## 2022-09-01 DIAGNOSIS — E7800 Pure hypercholesterolemia, unspecified: Secondary | ICD-10-CM | POA: Diagnosis not present

## 2022-09-01 DIAGNOSIS — J302 Other seasonal allergic rhinitis: Secondary | ICD-10-CM | POA: Diagnosis not present

## 2022-09-01 DIAGNOSIS — Z0000 Encounter for general adult medical examination without abnormal findings: Secondary | ICD-10-CM | POA: Diagnosis not present

## 2022-09-01 DIAGNOSIS — I10 Essential (primary) hypertension: Secondary | ICD-10-CM | POA: Diagnosis not present

## 2022-09-06 DIAGNOSIS — M9903 Segmental and somatic dysfunction of lumbar region: Secondary | ICD-10-CM | POA: Diagnosis not present

## 2022-09-06 DIAGNOSIS — M9902 Segmental and somatic dysfunction of thoracic region: Secondary | ICD-10-CM | POA: Diagnosis not present

## 2022-09-06 DIAGNOSIS — M9901 Segmental and somatic dysfunction of cervical region: Secondary | ICD-10-CM | POA: Diagnosis not present

## 2022-09-06 DIAGNOSIS — M5386 Other specified dorsopathies, lumbar region: Secondary | ICD-10-CM | POA: Diagnosis not present

## 2022-09-13 DIAGNOSIS — M9901 Segmental and somatic dysfunction of cervical region: Secondary | ICD-10-CM | POA: Diagnosis not present

## 2022-09-13 DIAGNOSIS — M9902 Segmental and somatic dysfunction of thoracic region: Secondary | ICD-10-CM | POA: Diagnosis not present

## 2022-09-13 DIAGNOSIS — M9903 Segmental and somatic dysfunction of lumbar region: Secondary | ICD-10-CM | POA: Diagnosis not present

## 2022-09-13 DIAGNOSIS — M5386 Other specified dorsopathies, lumbar region: Secondary | ICD-10-CM | POA: Diagnosis not present

## 2022-09-20 DIAGNOSIS — M5386 Other specified dorsopathies, lumbar region: Secondary | ICD-10-CM | POA: Diagnosis not present

## 2022-09-20 DIAGNOSIS — M9901 Segmental and somatic dysfunction of cervical region: Secondary | ICD-10-CM | POA: Diagnosis not present

## 2022-09-20 DIAGNOSIS — M9903 Segmental and somatic dysfunction of lumbar region: Secondary | ICD-10-CM | POA: Diagnosis not present

## 2022-09-20 DIAGNOSIS — M9902 Segmental and somatic dysfunction of thoracic region: Secondary | ICD-10-CM | POA: Diagnosis not present

## 2022-09-29 DIAGNOSIS — M9901 Segmental and somatic dysfunction of cervical region: Secondary | ICD-10-CM | POA: Diagnosis not present

## 2022-09-29 DIAGNOSIS — M9903 Segmental and somatic dysfunction of lumbar region: Secondary | ICD-10-CM | POA: Diagnosis not present

## 2022-09-29 DIAGNOSIS — M9902 Segmental and somatic dysfunction of thoracic region: Secondary | ICD-10-CM | POA: Diagnosis not present

## 2022-09-29 DIAGNOSIS — M5386 Other specified dorsopathies, lumbar region: Secondary | ICD-10-CM | POA: Diagnosis not present

## 2022-10-06 ENCOUNTER — Ambulatory Visit (AMBULATORY_SURGERY_CENTER): Payer: BC Managed Care – PPO | Admitting: *Deleted

## 2022-10-06 ENCOUNTER — Encounter: Payer: Self-pay | Admitting: Internal Medicine

## 2022-10-06 VITALS — Ht 63.5 in | Wt 160.0 lb

## 2022-10-06 DIAGNOSIS — Z1211 Encounter for screening for malignant neoplasm of colon: Secondary | ICD-10-CM

## 2022-10-06 MED ORDER — NA SULFATE-K SULFATE-MG SULF 17.5-3.13-1.6 GM/177ML PO SOLN: 1.0000 | Freq: Once | ORAL | 0 refills | Status: AC

## 2022-10-06 NOTE — Progress Notes (Addendum)
Pt's name and DOB verified at the beginning of the pre-visit.  Pt denies any difficulty with ambulating.,sitting laying down or turning side to side No egg or soy allergy known to patient  No issues known to pt with past sedation with any surgeries or procedures Patient denies ever being intubated Pt has no issues moving head neck or swallowing No FH of Malignant Hyperthermia Pt is not on diet pills Pt is not on home 02  Pt is not on blood thinners  Pt denies issues with constipation  Pt is not on dialysis Pt denies any upcoming cardiac testing Pt encouraged to use to use Singlecare or Goodrx to reduce cost  Patient's chart reviewed by Cathlyn Parsons CNRA prior to pre-visit and patient appropriate for the [facility].  Pre-visit completed and red dot placed by patient's name on their procedure day (on provider's schedule).  . Visit by phone Pt states weight is 160 lb Instructed pt why it is important to and  to call if they have any changes in health or new medications. Directed them to the # given and on instructions.   Pt states they will.  Instructions reviewed with pt and pt states understanding. Instructed to review again prior to procedure. Pt states they will.  Instructions sent by mail with coupon and by my chart

## 2022-10-13 ENCOUNTER — Ambulatory Visit
Admission: RE | Admit: 2022-10-13 | Discharge: 2022-10-13 | Disposition: A | Discharge status: Home / Self Care | Payer: BC Managed Care – PPO | Source: Ambulatory Visit | Attending: Physician Assistant | Admitting: Physician Assistant

## 2022-10-13 DIAGNOSIS — Z1231 Encounter for screening mammogram for malignant neoplasm of breast: Secondary | ICD-10-CM

## 2022-10-29 ENCOUNTER — Encounter: Payer: Self-pay | Admitting: Internal Medicine

## 2022-10-29 ENCOUNTER — Ambulatory Visit (AMBULATORY_SURGERY_CENTER): Payer: BC Managed Care – PPO | Admitting: Internal Medicine

## 2022-10-29 VITALS — BP 142/68 | HR 49 | Temp 97.5°F | Resp 16 | Ht 63.0 in | Wt 160.0 lb

## 2022-10-29 DIAGNOSIS — Z1211 Encounter for screening for malignant neoplasm of colon: Secondary | ICD-10-CM

## 2022-10-29 MED ORDER — SODIUM CHLORIDE 0.9 % IV SOLN: 500.0000 mL | Freq: Once | INTRAVENOUS | Status: DC

## 2022-10-29 NOTE — Progress Notes (Signed)
Pt's states no medical or surgical changes since previsit or office visit. 

## 2022-10-29 NOTE — Progress Notes (Signed)
Gastroenterology History and Physical   Primary Care Physician:  Milus Height, PA   Reason for Procedure:   CRCA screening  Plan:    colonoscopy     HPI: Cheyenne Armstrong is a 62 y.o. female for repeat screening colonoscopy, last exam negative I n2013   Past Medical History:  Diagnosis Date   Hyperlipidemia    Hypertension    Low back pain    chronic   Neck pain on right side     Past Surgical History:  Procedure Laterality Date   COLONOSCOPY     DILATION AND CURETTAGE OF UTERUS     1998   MOUTH SURGERY     WISDOM TOOTH EXTRACTION      Prior to Admission medications   Medication Sig Start Date End Date Taking? Authorizing Provider  Bempedoic Acid-Ezetimibe (NEXLIZET PO) Take 10 mg by mouth daily at 6 (six) AM. Ezetimibe   Yes [provider]  brimonidine (ALPHAGAN P) 0.1 % SOLN SMARTSIG:1 Drop(s) In Eye(s) Every 12 Hours 08/03/22  Yes [provider]  hydrochlorothiazide (HYDRODIURIL) 25 MG tablet Take 25 mg by mouth daily.   Yes [provider]  Biotin 1000 MCG tablet Take 1,000 mcg by mouth daily.    [provider]  Calcium Carbonate-Vitamin D (CALTRATE 600+D) 600-400 MG-UNIT per tablet Take 1 tablet by mouth daily.    [provider]  COVID-19 mRNA bivalent vaccine, Pfizer, injection Inject into the muscle. 03/13/21   Judyann Munson, MD  COVID-19 mRNA Vac-TriS, Pfizer, (PFIZER-BIONT COVID-19 VAC-TRIS) SUSP injection Inject into the muscle. 09/25/20   Judyann Munson, MD  influenza vac split quadrivalent PF (FLUARIX) 0.5 ML injection Inject into the muscle. 03/13/21   Judyann Munson, MD  loratadine (CLARITIN) 10 MG tablet 1 tablet Orally Once a day    [provider]  Multiple Vitamin (MULTIVITAMIN) tablet Take 1 tablet by mouth. One a day 50 plus MVI-Take one daily    [provider]    Current Outpatient Medications  Medication Sig Dispense Refill   Bempedoic Acid-Ezetimibe (NEXLIZET PO) Take  10 mg by mouth daily at 6 (six) AM. Ezetimibe     brimonidine (ALPHAGAN P) 0.1 % SOLN SMARTSIG:1 Drop(s) In Eye(s) Every 12 Hours     hydrochlorothiazide (HYDRODIURIL) 25 MG tablet Take 25 mg by mouth daily.     Calcium Carbonate-Vitamin D (CALTRATE 600+D) 600-400 MG-UNIT per tablet Take 1 tablet by mouth daily.     loratadine (CLARITIN) 10 MG tablet 1 tablet Orally Once a day     Multiple Vitamin (MULTIVITAMIN) tablet Take 1 tablet by mouth. One a day 50 plus MVI-Take one daily     Current Facility-Administered Medications  Medication Dose Route Frequency Provider Last Rate Last Admin   0.9 %  sodium chloride infusion  500 mL Intravenous Once Iva Boop, MD        Allergies as of 10/29/2022 - Review Complete 10/29/2022  Allergen Reaction Noted   Acetaminophen Hives and Other (See Comments) 03/28/2012   Atorvastatin  10/06/2022   Kiwi extract Swelling 09/14/2017   Prunus persica Swelling 09/14/2017   Rosuvastatin Other (See Comments) 10/06/2022   Simvastatin  10/06/2022   Sulfamethoxazole Hives 09/14/2017   Sulfamethoxazole-trimethoprim Other (See Comments) 10/06/2022    Family History  Problem Relation Age of Onset   Diabetes Mother    Breast cancer Cousin    Colon cancer Neg Hx    Rectal cancer Neg Hx    Stomach cancer Neg Hx  Colon polyps Neg Hx    Esophageal cancer Neg Hx     Social History   Socioeconomic History   Marital status: Single    Spouse name: Not on file   Number of children: Not on file   Years of education: Not on file   Highest education level: Not on file  Occupational History   Not on file  Tobacco Use   Smoking status: Never   Smokeless tobacco: Never  Substance and Sexual Activity   Alcohol use: No   Drug use: No   Sexual activity: Yes    Birth control/protection: Condom, Post-menopausal  Other Topics Concern   Not on file  Social History Narrative   Not on file   Social Determinants of Health   Financial Resource Strain: Not  on file  Food Insecurity: Not on file  Transportation Needs: Not on file  Physical Activity: Not on file  Stress: Not on file  Social Connections: Not on file  Intimate Partner Violence: Not on file    Review of Systems:  All other review of systems negative except as mentioned in the HPI.  Physical Exam: Vital signs BP (!) 164/90   Pulse (!) 55   Temp (!) 97.5 F (36.4 C)   Resp 10   Ht 5\' 3"  (1.6 m)   Wt 160 lb (72.6 kg)   SpO2 100%   BMI 28.34 kg/m   General:   Alert,  Well-developed, well-nourished, pleasant and cooperative in NAD Lungs:  Clear throughout to auscultation.   Heart:  Regular rate and rhythm; no murmurs, clicks, rubs,  or gallops. Abdomen:  Soft, nontender and nondistended. Normal bowel sounds.   Neuro/Psych:  Alert and cooperative. Normal mood and affect. A and O x 3   @Dwanda Tufano  Sena Slate, MD, [facility] [phone] (pager) 10/29/2022 9:59 AM@

## 2022-10-29 NOTE — Patient Instructions (Addendum)
No polyps or cancer seen today.   You do have a condition called diverticulosis - common and not usually a problem. Please read the handout provided.  Next routine colonoscopy or other screening test in 10 years - 2034.  I appreciate the opportunity to care for you. Iva Boop, MD, [facility]    HANDOUT ON DIVERTICULOSIS GIVEN TO YOU TODAY  RESUME USUAL DIET AND MEDICATIONS     YOU HAD AN ENDOSCOPIC PROCEDURE TODAY AT THE [facility]:   Refer to the procedure report that was given to you for any specific questions about what was found during the examination.  If the procedure report does not answer your questions, please call your gastroenterologist to clarify.  If you requested that your care partner not be given the details of your procedure findings, then the procedure report has been included in a sealed envelope for you to review at your convenience later.  YOU SHOULD EXPECT: Some feelings of bloating in the abdomen. Passage of more gas than usual.  Walking can help get rid of the air that was put into your GI tract during the procedure and reduce the bloating. If you had a lower endoscopy (such as a colonoscopy or flexible sigmoidoscopy) you may notice spotting of blood in your stool or on the toilet paper. If you underwent a bowel prep for your procedure, you may not have a normal bowel movement for a few days.  Please Note:  You might notice some irritation and congestion in your nose or some drainage.  This is from the oxygen used during your procedure.  There is no need for concern and it should clear up in a day or so.  SYMPTOMS TO REPORT IMMEDIATELY:  Following lower endoscopy (colonoscopy or flexible sigmoidoscopy):  Excessive amounts of blood in the stool  Significant tenderness or worsening of abdominal pains  Swelling of the abdomen that is new, acute  Fever of 100F or higher  For urgent or emergent issues, a gastroenterologist can be reached at any hour by  calling [phone]. Do not use MyChart messaging for urgent concerns.    DIET:  We do recommend a small meal at first, but then you may proceed to your regular diet.  Drink plenty of fluids but you should avoid alcoholic beverages for 24 hours.  ACTIVITY:  You should plan to take it easy for the rest of today and you should NOT DRIVE or use heavy machinery until tomorrow (because of the sedation medicines used during the test).    FOLLOW UP: Our staff will call the number listed on your records the next business day following your procedure.  We will call around 7:15- 8:00 am to check on you and address any questions or concerns that you may have regarding the information given to you following your procedure. If we do not reach you, we will leave a message.     If any biopsies were taken you will be contacted by phone or by letter within the next 1-3 weeks.  Please call us at [phone] if you have not heard about the biopsies in 3 weeks.    SIGNATURES/CONFIDENTIALITY: You and/or your care partner have signed paperwork which will be entered into your electronic medical record.  These signatures attest to the fact that that the information above on your After Visit Summary has been reviewed and is understood.  Full responsibility of the confidentiality of this discharge information lies with you and/or your care-partner.

## 2022-10-29 NOTE — Op Note (Signed)
[facility] Patient Name: Cheyenne Armstrong Procedure Date: 10/29/2022 9:49 AM MRN: [id number] Endoscopist: Iva Boop , MD, [id number] Age: 62 Referring MD:  Date of Birth: [birth year] Gender: Female Account #: [id number] Procedure:                Colonoscopy Indications:              Screening for colorectal malignant neoplasm, Last                            colonoscopy: December 2013 Medicines:                Monitored Anesthesia Care Procedure:                Pre-Anesthesia Assessment:                           - Prior to the procedure, a History and Physical                            was performed, and patient medications and                            allergies were reviewed. The patient's tolerance of                            previous anesthesia was also reviewed. The risks                            and benefits of the procedure and the sedation                            options and risks were discussed with the patient.                            All questions were answered, and informed consent                            was obtained. Prior Anticoagulants: The patient has                            taken no anticoagulant or antiplatelet agents. ASA                            Grade Assessment: II - A patient with mild systemic                            disease. After reviewing the risks and benefits,                            the patient was deemed in satisfactory condition to                            undergo the procedure.  After obtaining informed consent, the colonoscope                            was passed under direct vision. Throughout the                            procedure, the patient's blood pressure, pulse, and                            oxygen saturations were monitored continuously. The                            Olympus PCF-H190DL (#[id number]) Colonoscope was                            introduced through the anus and  advanced to the the                            cecum, identified by appendiceal orifice and                            ileocecal valve. The colonoscopy was performed                            without difficulty. The patient tolerated the                            procedure well. The quality of the bowel                            preparation was excellent. The bowel preparation                            used was SUPREP via split dose instruction. The                            ileocecal valve, appendiceal orifice, and rectum                            were photographed. Scope In: 10:03:12 AM Scope Out: 10:12:52 AM Scope Withdrawal Time: 0 hours 6 minutes 40 seconds  Total Procedure Duration: 0 hours 9 minutes 40 seconds  Findings:                 The perianal and digital rectal examinations were                            normal.                           Scattered small-mouthed diverticula were found in                            the entire colon.  The exam was otherwise without abnormality on                            direct and retroflexion views. Complications:            No immediate complications. Estimated Blood Loss:     Estimated blood loss: none. Impression:               - Diverticulosis in the entire examined colon.                           - The examination was otherwise normal on direct                            and retroflexion views.                           - No specimens collected. Recommendation:           - Patient has a contact number available for                            emergencies. The signs and symptoms of potential                            delayed complications were discussed with the                            patient. Return to normal activities tomorrow.                            Written discharge instructions were provided to the                            patient.                           - Resume previous diet.                            - Continue present medications.                           - Repeat colonoscopy in 10 years for screening                            purposes. Iva Boop, MD 10/29/2022 10:17:07 AM This report has been signed electronically.

## 2022-10-29 NOTE — Progress Notes (Signed)
Report to PACU, RN, vss, BBS= Clear.  

## 2022-11-01 ENCOUNTER — Telehealth: Payer: Self-pay

## 2022-11-01 NOTE — Telephone Encounter (Signed)
  Follow up Call-     10/29/2022    9:37 AM  Call back number  Post procedure Call Back phone  # [phone]  Permission to leave phone message Yes    2 Patient questions:  Do you have a fever, pain , or abdominal swelling? No. Pain Score  0 *  Have you tolerated food without any problems? yes  Have you been able to return to your normal activities? yes  Do you have any questions about your discharge instructions: Diet   No. Medications  No. Follow up visit  No.  Do you have questions or concerns about your Care? No.  Actions: * If pain score is 4 or above: No action needed, pain <4.

## 2022-12-13 DIAGNOSIS — T50905A Adverse effect of unspecified drugs, medicaments and biological substances, initial encounter: Secondary | ICD-10-CM | POA: Diagnosis not present

## 2022-12-13 DIAGNOSIS — H11433 Conjunctival hyperemia, bilateral: Secondary | ICD-10-CM | POA: Diagnosis not present

## 2022-12-29 DIAGNOSIS — Z124 Encounter for screening for malignant neoplasm of cervix: Secondary | ICD-10-CM | POA: Diagnosis not present

## 2022-12-29 DIAGNOSIS — Z01419 Encounter for gynecological examination (general) (routine) without abnormal findings: Secondary | ICD-10-CM | POA: Diagnosis not present

## 2023-01-21 DIAGNOSIS — H401231 Low-tension glaucoma, bilateral, mild stage: Secondary | ICD-10-CM | POA: Diagnosis not present

## 2023-04-04 DIAGNOSIS — N762 Acute vulvitis: Secondary | ICD-10-CM | POA: Diagnosis not present

## 2023-09-05 DIAGNOSIS — Z0000 Encounter for general adult medical examination without abnormal findings: Secondary | ICD-10-CM | POA: Diagnosis not present

## 2023-09-05 DIAGNOSIS — E7800 Pure hypercholesterolemia, unspecified: Secondary | ICD-10-CM | POA: Diagnosis not present

## 2023-09-05 DIAGNOSIS — J302 Other seasonal allergic rhinitis: Secondary | ICD-10-CM | POA: Diagnosis not present

## 2023-09-05 DIAGNOSIS — Z23 Encounter for immunization: Secondary | ICD-10-CM | POA: Diagnosis not present

## 2023-09-05 DIAGNOSIS — I10 Essential (primary) hypertension: Secondary | ICD-10-CM | POA: Diagnosis not present

## 2023-10-03 DIAGNOSIS — M9903 Segmental and somatic dysfunction of lumbar region: Secondary | ICD-10-CM | POA: Diagnosis not present

## 2023-10-03 DIAGNOSIS — M5386 Other specified dorsopathies, lumbar region: Secondary | ICD-10-CM | POA: Diagnosis not present

## 2023-10-03 DIAGNOSIS — M9902 Segmental and somatic dysfunction of thoracic region: Secondary | ICD-10-CM | POA: Diagnosis not present

## 2023-10-03 DIAGNOSIS — M9901 Segmental and somatic dysfunction of cervical region: Secondary | ICD-10-CM | POA: Diagnosis not present

## 2023-10-05 DIAGNOSIS — M9901 Segmental and somatic dysfunction of cervical region: Secondary | ICD-10-CM | POA: Diagnosis not present

## 2023-10-05 DIAGNOSIS — M9903 Segmental and somatic dysfunction of lumbar region: Secondary | ICD-10-CM | POA: Diagnosis not present

## 2023-10-05 DIAGNOSIS — M9902 Segmental and somatic dysfunction of thoracic region: Secondary | ICD-10-CM | POA: Diagnosis not present

## 2023-10-05 DIAGNOSIS — M5386 Other specified dorsopathies, lumbar region: Secondary | ICD-10-CM | POA: Diagnosis not present

## 2023-10-10 DIAGNOSIS — M9902 Segmental and somatic dysfunction of thoracic region: Secondary | ICD-10-CM | POA: Diagnosis not present

## 2023-10-10 DIAGNOSIS — M9901 Segmental and somatic dysfunction of cervical region: Secondary | ICD-10-CM | POA: Diagnosis not present

## 2023-10-10 DIAGNOSIS — M5386 Other specified dorsopathies, lumbar region: Secondary | ICD-10-CM | POA: Diagnosis not present

## 2023-10-10 DIAGNOSIS — M9903 Segmental and somatic dysfunction of lumbar region: Secondary | ICD-10-CM | POA: Diagnosis not present

## 2023-10-12 DIAGNOSIS — M9901 Segmental and somatic dysfunction of cervical region: Secondary | ICD-10-CM | POA: Diagnosis not present

## 2023-10-12 DIAGNOSIS — M9902 Segmental and somatic dysfunction of thoracic region: Secondary | ICD-10-CM | POA: Diagnosis not present

## 2023-10-12 DIAGNOSIS — M5386 Other specified dorsopathies, lumbar region: Secondary | ICD-10-CM | POA: Diagnosis not present

## 2023-10-12 DIAGNOSIS — M9903 Segmental and somatic dysfunction of lumbar region: Secondary | ICD-10-CM | POA: Diagnosis not present

## 2023-10-17 DIAGNOSIS — M5386 Other specified dorsopathies, lumbar region: Secondary | ICD-10-CM | POA: Diagnosis not present

## 2023-10-17 DIAGNOSIS — M9902 Segmental and somatic dysfunction of thoracic region: Secondary | ICD-10-CM | POA: Diagnosis not present

## 2023-10-17 DIAGNOSIS — M9903 Segmental and somatic dysfunction of lumbar region: Secondary | ICD-10-CM | POA: Diagnosis not present

## 2023-10-17 DIAGNOSIS — M9901 Segmental and somatic dysfunction of cervical region: Secondary | ICD-10-CM | POA: Diagnosis not present

## 2023-10-19 DIAGNOSIS — M9901 Segmental and somatic dysfunction of cervical region: Secondary | ICD-10-CM | POA: Diagnosis not present

## 2023-10-19 DIAGNOSIS — M9903 Segmental and somatic dysfunction of lumbar region: Secondary | ICD-10-CM | POA: Diagnosis not present

## 2023-10-19 DIAGNOSIS — M9902 Segmental and somatic dysfunction of thoracic region: Secondary | ICD-10-CM | POA: Diagnosis not present

## 2023-10-19 DIAGNOSIS — M5386 Other specified dorsopathies, lumbar region: Secondary | ICD-10-CM | POA: Diagnosis not present

## 2023-10-26 DIAGNOSIS — M9901 Segmental and somatic dysfunction of cervical region: Secondary | ICD-10-CM | POA: Diagnosis not present

## 2023-10-26 DIAGNOSIS — M9903 Segmental and somatic dysfunction of lumbar region: Secondary | ICD-10-CM | POA: Diagnosis not present

## 2023-10-26 DIAGNOSIS — M9902 Segmental and somatic dysfunction of thoracic region: Secondary | ICD-10-CM | POA: Diagnosis not present

## 2023-10-26 DIAGNOSIS — M5386 Other specified dorsopathies, lumbar region: Secondary | ICD-10-CM | POA: Diagnosis not present

## 2023-11-02 DIAGNOSIS — M9902 Segmental and somatic dysfunction of thoracic region: Secondary | ICD-10-CM | POA: Diagnosis not present

## 2023-11-02 DIAGNOSIS — M9901 Segmental and somatic dysfunction of cervical region: Secondary | ICD-10-CM | POA: Diagnosis not present

## 2023-11-02 DIAGNOSIS — M5386 Other specified dorsopathies, lumbar region: Secondary | ICD-10-CM | POA: Diagnosis not present

## 2023-11-02 DIAGNOSIS — M9903 Segmental and somatic dysfunction of lumbar region: Secondary | ICD-10-CM | POA: Diagnosis not present

## 2023-12-01 DIAGNOSIS — H16143 Punctate keratitis, bilateral: Secondary | ICD-10-CM | POA: Diagnosis not present

## 2024-01-17 DIAGNOSIS — H401231 Low-tension glaucoma, bilateral, mild stage: Secondary | ICD-10-CM | POA: Diagnosis not present

## 2024-03-09 ENCOUNTER — Other Ambulatory Visit: Payer: Self-pay | Admitting: Physician Assistant

## 2024-03-09 DIAGNOSIS — Z1231 Encounter for screening mammogram for malignant neoplasm of breast: Secondary | ICD-10-CM

## 2024-03-21 ENCOUNTER — Ambulatory Visit
Admission: RE | Admit: 2024-03-21 | Discharge: 2024-03-21 | Disposition: A | Discharge status: Home / Self Care | Source: Ambulatory Visit | Attending: Physician Assistant | Admitting: Physician Assistant

## 2024-03-21 DIAGNOSIS — Z1231 Encounter for screening mammogram for malignant neoplasm of breast: Secondary | ICD-10-CM | POA: Diagnosis not present

## 2024-04-01 DIAGNOSIS — R059 Cough, unspecified: Secondary | ICD-10-CM | POA: Diagnosis not present

## 2024-04-01 DIAGNOSIS — H6123 Impacted cerumen, bilateral: Secondary | ICD-10-CM | POA: Diagnosis not present

## 2024-04-01 DIAGNOSIS — H9192 Unspecified hearing loss, left ear: Secondary | ICD-10-CM | POA: Diagnosis not present

## 2024-05-21 DIAGNOSIS — Z131 Encounter for screening for diabetes mellitus: Secondary | ICD-10-CM | POA: Diagnosis not present

## 2024-05-21 DIAGNOSIS — Z124 Encounter for screening for malignant neoplasm of cervix: Secondary | ICD-10-CM | POA: Diagnosis not present

## 2024-05-21 DIAGNOSIS — Z01419 Encounter for gynecological examination (general) (routine) without abnormal findings: Secondary | ICD-10-CM | POA: Diagnosis not present

## 2024-05-21 DIAGNOSIS — Z13820 Encounter for screening for osteoporosis: Secondary | ICD-10-CM | POA: Diagnosis not present

## 2024-07-04 ENCOUNTER — Ambulatory Visit (INDEPENDENT_AMBULATORY_CARE_PROVIDER_SITE_OTHER): Admitting: Otolaryngology

## 2024-07-04 ENCOUNTER — Encounter (INDEPENDENT_AMBULATORY_CARE_PROVIDER_SITE_OTHER): Payer: Self-pay | Admitting: Otolaryngology

## 2024-07-04 VITALS — BP 125/80 | HR 65 | Ht 63.0 in | Wt 174.0 lb

## 2024-07-04 DIAGNOSIS — H900 Conductive hearing loss, bilateral: Secondary | ICD-10-CM

## 2024-07-04 DIAGNOSIS — H6123 Impacted cerumen, bilateral: Secondary | ICD-10-CM

## 2024-07-04 NOTE — Progress Notes (Unsigned)
 CC: Muffled hearing, worse on the left side  Discussed the use of AI scribe software for clinical note transcription with the patient, who gave verbal consent to proceed.  History of Present Illness Cheyenne Armstrong is a 64 year old female who presents today complaining of muffled hearing, worse on the left side.  She reports recurrent episodes of left ear conductive hearing loss, secondary to cerumen impaction, resulting in reliance on her right ear for auditory function. She denies otalgia and has no prior history of otitis media. The right ear is generally asymptomatic, with only occasional minor cerumen accumulation.  The initial episode occurred in October 2025 and was managed with ear irrigation at a walk-in clinic. Symptoms recurred several weeks prior to this visit, leading to multiple irrigation attempts by her primary care provider, which were limited by a markedly narrow left ear canal and resulted in local irritation. She was prescribed medication for possible canal irritation. She also attempted home treatments, including Debrox ear drops and over-the-counter ear cleaning kits, without resolution of impaction and with possible further cerumen compaction.  She has a history of bilateral stenotic ear canals.     Past Medical History:  Diagnosis Date   Hyperlipidemia    Hypertension    Low back pain    chronic   Neck pain on right side     Past Surgical History:  Procedure Laterality Date   COLONOSCOPY     DILATION AND CURETTAGE OF UTERUS     1998   MOUTH SURGERY     WISDOM TOOTH EXTRACTION      Family History  Problem Relation Age of Onset   Diabetes Mother    Breast cancer Cousin    Colon cancer Neg Hx    Rectal cancer Neg Hx    Stomach cancer Neg Hx    Colon polyps Neg Hx    Esophageal cancer Neg Hx     Social History:  reports that she has never smoked. She has never used smokeless tobacco. She reports that she does not drink alcohol and does not use  drugs.  Allergies: Allergies[1]  Prior to Admission medications  Medication Sig Start Date End Date Taking? Authorizing Provider  Bempedoic Acid-Ezetimibe (NEXLIZET PO) Take 10 mg by mouth daily at 6 (six) AM. Ezetimibe    [provider]  brimonidine (ALPHAGAN P) 0.1 % SOLN SMARTSIG:1 Drop(s) In Eye(s) Every 12 Hours 08/03/22   [provider]  Calcium Carbonate-Vitamin D (CALTRATE 600+D) 600-400 MG-UNIT per tablet Take 1 tablet by mouth daily.    [provider]  hydrochlorothiazide (HYDRODIURIL) 25 MG tablet Take 25 mg by mouth daily.    [provider]  loratadine (CLARITIN) 10 MG tablet 1 tablet Orally Once a day    [provider]  Multiple Vitamin (MULTIVITAMIN) tablet Take 1 tablet by mouth. One a day 50 plus MVI-Take one daily    [provider]    Blood pressure 125/80, pulse 65, height 5' 3 (1.6 m), weight 174 lb (78.9 kg), SpO2 97%. Exam: General: Communicates without difficulty, well nourished, no acute distress. Head: Normocephalic, no evidence injury, no tenderness, facial buttresses intact without stepoff. Face/sinus: No tenderness to palpation and percussion. Facial movement is normal and symmetric. Eyes: PERRL, EOMI. No scleral icterus, conjunctivae clear. Neuro: CN II exam reveals vision grossly intact.  No nystagmus at any point of gaze. Ears: Auricles well formed without lesions.  Bilateral cerumen impaction.  Both ear canals are stenotic.  Nose: External evaluation  reveals normal support and skin without lesions.  Dorsum is intact.  Anterior rhinoscopy reveals congested mucosa over anterior aspect of inferior turbinates and intact septum.  No purulence noted. Oral:  Oral cavity and oropharynx are intact, symmetric, without erythema or edema.  Mucosa is moist without lesions. Neck: Full range of motion without pain.  There is no significant lymphadenopathy.  No masses palpable.  Thyroid bed within normal limits to palpation.   Parotid glands and submandibular glands equal bilaterally without mass.  Trachea is midline. Neuro:  CN 2-12 grossly intact.   Procedure: Bilateral cerumen disimpaction Anesthesia: None Description: Under the operating microscope, the cerumen is carefully removed with a combination of cerumen currette, alligator forceps, and suction catheters.  After the cerumen is removed, the TMs are noted to be normal.  No mass, erythema, or lesions. The patient tolerated the procedure well.    Assessment & Plan Conductive hearing loss, secondary to bilateral cerumen impaction, worse on the left side. The patient has bilateral severely stenotic ear canals. -Otomicroscopy with bilateral cerumen disimpaction. - The physical exam findings are reviewed with the patient. - The patient reports significant improvement in her hearing after the disimpaction procedure. - Debrox eardrops as needed. - The patient will return for reevaluation in 6 months.       Jen Eppinger W Avalee Castrellon 07/04/2024, 2:50 PM      [1]  Allergies Allergen Reactions   Acetaminophen Hives and Other (See Comments)    hives   Atorvastatin     Other Reaction(s): leg pain   Kiwi Extract Swelling   Prunus Persica Swelling    Tongue swelling.   Rosuvastatin Other (See Comments)   Simvastatin     Other Reaction(s): muscle aches/leg pain   Sulfamethoxazole Hives   Sulfamethoxazole-Trimethoprim Other (See Comments)

## 2024-07-05 DIAGNOSIS — H6123 Impacted cerumen, bilateral: Secondary | ICD-10-CM | POA: Insufficient documentation

## 2024-07-05 DIAGNOSIS — H900 Conductive hearing loss, bilateral: Secondary | ICD-10-CM | POA: Insufficient documentation

## 2025-01-02 ENCOUNTER — Ambulatory Visit (INDEPENDENT_AMBULATORY_CARE_PROVIDER_SITE_OTHER): Admitting: Otolaryngology
